# Patient Record
Sex: Female | Born: 2005 | Race: Black or African American | Hispanic: No | Marital: Single | Smoking: Never smoker
Health system: Southern US, Community
[De-identification: ages and names within clinical notes are randomized; demographics above are authoritative.]

## PROBLEM LIST (undated history)

## (undated) DIAGNOSIS — J302 Other seasonal allergic rhinitis: Secondary | ICD-10-CM

---
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
## 2024-02-09 ENCOUNTER — Other Ambulatory Visit: Payer: Self-pay

## 2024-02-09 ENCOUNTER — Encounter (HOSPITAL_COMMUNITY): Payer: Self-pay | Admitting: Emergency Medicine

## 2024-02-09 ENCOUNTER — Emergency Department (HOSPITAL_COMMUNITY)
Admission: EM | Admit: 2024-02-09 | Discharge: 2024-02-10 | Disposition: A | Discharge status: Home / Self Care | Attending: Emergency Medicine | Admitting: Emergency Medicine

## 2024-02-09 DIAGNOSIS — S50861A Insect bite (nonvenomous) of right forearm, initial encounter: Secondary | ICD-10-CM | POA: Diagnosis not present

## 2024-02-09 DIAGNOSIS — R000 Tachycardia, unspecified: Secondary | ICD-10-CM | POA: Diagnosis not present

## 2024-02-09 DIAGNOSIS — M79631 Pain in right forearm: Secondary | ICD-10-CM | POA: Diagnosis present

## 2024-02-09 DIAGNOSIS — W57XXXA Bitten or stung by nonvenomous insect and other nonvenomous arthropods, initial encounter: Secondary | ICD-10-CM | POA: Diagnosis not present

## 2024-02-09 NOTE — ED Provider Notes (Incomplete)
  [place] EMERGENCY DEPARTMENT AT [facility] Provider Note   CSN: [id number] Arrival date & time: 02/09/24  2243     Patient presents with: Insect Bite   Melanie Gould is a 18 y.o. female.  {Add pertinent medical, surgical, social history, OB history to HPI:32947} HPI     Prior to Admission medications   Medication Sig Start Date End Date Taking? Authorizing Provider  amoxicillin  (AMOXIL ) 400 MG/5ML suspension Take 6.3 mLs (500 mg total) by mouth 3 (three) times daily. Dispense QS for 7 days 05/02/14   Elpidio Reyes DEL, MD  HYDROcodone -acetaminophen  (LORTAB) 7.5-500 MG/15ML solution Take 5-15 mLs by mouth every 4 (four) hours as needed (5ml for mild pain, 10ml for moderate pain, 15ml for severe pain). Patient not taking: No sig reported 04/02/12   Juliane Ozell PARAS, PA-C  montelukast  (SINGULAIR ) 10 MG tablet Take 10 mg by mouth at bedtime.    [provider]    Allergies: Bee venom    Review of Systems  Updated Vital Signs BP 125/75   Pulse (!) 116   Temp 98.6 F (37 C) (Oral)   Resp (!) 33   Ht 5' 7 (1.702 m)   Wt 104.3 kg   LMP 01/20/2024 (Exact Date)   SpO2 100%   BMI 36.02 kg/m   Physical Exam  (all labs ordered are listed, but only abnormal results are displayed) Labs Reviewed - No data to display  EKG: EKG Interpretation Date/Time:  Monday February 09 2024 22:57:24 EDT Ventricular Rate:  140 PR Interval:  82 QRS Duration:  82 QT Interval:  283 QTC Calculation: 432 R Axis:   51  Text Interpretation: Sinus tachycardia Consider right atrial enlargement Borderline T abnormalities, anterior leads No old tracing to compare Confirmed by Raford Lenis ([id number]) on 02/09/2024 11:55:13 PM  Radiology: No results found.  {Document cardiac monitor, telemetry assessment procedure when appropriate:32947} Procedures   Medications Ordered in the ED - No data to display    {Click here for ABCD2, HEART and other calculators REFRESH Note  before signing:1}                              Medical Decision Making  ***  {Document critical care time when appropriate  Document review of labs and clinical decision tools ie [id number], etc  Document your independent review of radiology images and any outside records  Document your discussion with family members, caretakers and with consultants  Document social determinants of health affecting pt's care  Document your decision making why or why not admission, treatments were needed:32947:::1}   Final diagnoses:  None    ED Discharge Orders     None

## 2024-02-09 NOTE — ED Provider Notes (Signed)
 [place] EMERGENCY DEPARTMENT AT [facility] Provider Note   CSN: [id number] Arrival date & time: 02/09/24  2243     Patient presents with: Insect Bite   Melanie Gould is a 18 y.o. female.   The history is provided by the patient.   She has no significant past history and comes in after being bitten or stung on her right forearm.  She states that she sat down on a tree stump and something flew up and either bit her or stung her.  She is complaining of pain in that area but denies any difficulty breathing or swallowing.  She denies any rash.    Prior to Admission medications   Medication Sig Start Date End Date Taking? Authorizing Provider  amoxicillin  (AMOXIL ) 400 MG/5ML suspension Take 6.3 mLs (500 mg total) by mouth 3 (three) times daily. Dispense QS for 7 days 05/02/14   Elpidio Reyes DEL, MD  HYDROcodone -acetaminophen  (LORTAB) 7.5-500 MG/15ML solution Take 5-15 mLs by mouth every 4 (four) hours as needed (5ml for mild pain, 10ml for moderate pain, 15ml for severe pain). Patient not taking: No sig reported 04/02/12   Juliane Ozell PARAS, PA-C  montelukast  (SINGULAIR ) 10 MG tablet Take 10 mg by mouth at bedtime.    [provider]    Allergies: Bee venom    Review of Systems  All other systems reviewed and are negative.   Updated Vital Signs BP 125/75   Pulse (!) 116   Temp 98.6 F (37 C) (Oral)   Resp (!) 33   Ht 5' 7 (1.702 m)   Wt 104.3 kg   LMP 01/20/2024 (Exact Date)   SpO2 100%   BMI 36.02 kg/m   Physical Exam Vitals and nursing note reviewed.   18 year old female, resting comfortably and in no acute distress. Vital signs are significant for rapid heart rate and respiratory rate. Oxygen saturation is 100%, which is normal. Head is normocephalic and atraumatic. PERRLA, EOMI.  Lungs are clear without rales, wheezes, or rhonchi. Chest is nontender. Heart has regular rate and rhythm without murmur. Abdomen is soft, flat,  nontender. Extremities: There is an area of slight induration in the proximal aspect of the right forearm flexor surface.  No erythema or warmth or swelling. Skin is warm and dry without rash. Neurologic: Mental status is normal, cranial nerves are intact, moves all extremities equally.   EKG: EKG Interpretation Date/Time:  Monday February 09 2024 22:57:24 EDT Ventricular Rate:  140 PR Interval:  82 QRS Duration:  82 QT Interval:  283 QTC Calculation: 432 R Axis:   51  Text Interpretation: Sinus tachycardia Consider right atrial enlargement Borderline T abnormalities, anterior leads No old tracing to compare Confirmed by Raford Lenis ([id number]) on 02/09/2024 11:55:13 PM   EKG Interpretation Date/Time:  Monday February 09 2024 23:05:55 EDT Ventricular Rate:  123 PR Interval:  123 QRS Duration:  85 QT Interval:  299 QTC Calculation: 428 R Axis:   52  Text Interpretation: Sinus tachycardia Consider right atrial enlargement When compared with ECG of EARLIER SAME DATE HEART RATE has decreased Confirmed by Raford Lenis ([id number]) on 02/09/2024 11:56:13 PM          Procedures   Medications Ordered in the ED - No data to display                                  Medical Decision Making  Probable insect sting of right forearm without evidence of systemic reaction.  Initial heart rate was noted to be markedly elevated at 144.  I have reviewed electrocardiogram and my interpretation is sinus tachycardia with borderline T wave abnormalities which may be rate related.  With simple observation heart rate has come down.  I reviewed her repeat electrocardiogram and my interpretation is sinus tachycardia but significantly slower with T wave changes no longer evident.  When I went in to see her in the room, heart rate was 88 and she is now breathing normally.  I suspect that tachycardia and tachypnea were related to anxiety.  No evidence of significant envenomation.  I have explained to the patient  treatment for insect stings and I am discharging her.     Final diagnoses:  Infected insect bite or sting  Sinus tachycardia    ED Discharge Orders     None          Raford Lenis, MD 02/10/24 [phone]

## 2024-02-09 NOTE — ED Triage Notes (Signed)
 Pt presents to the ED via POV with complaints of an insect bite to the RFA tonight. Pt states that she is unsure what she was bitten by but she has a stinging sensation. No redness nor swelling noted - pt has tried topical ointment with no improvement in sx. Pt noted to be anxious and tachy in triage. A&Ox4 at this time. Denies CP or SOB.

## 2024-02-10 NOTE — Discharge Instructions (Addendum)
 Apply ice as needed.  Ice can be applied for 30 minutes at a time up to 4 times a day.  You may take acetaminophen  and/or ibuprofen  as needed for pain.  You may take diphenhydramine as needed for itching.  You may apply hydrocortisone cream twice a day.
